# Patient Record
Sex: Male | Born: 1985 | Race: Black or African American | Hispanic: No | Marital: Single | Smoking: Current every day smoker
Health system: Southern US, Community
[De-identification: ages and names within clinical notes are randomized; demographics above are authoritative.]

---
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
## 2009-09-27 ENCOUNTER — Emergency Department (HOSPITAL_BASED_OUTPATIENT_CLINIC_OR_DEPARTMENT_OTHER): Admission: EM | Admit: 2009-09-27 | Discharge: 2009-09-27 | Payer: Self-pay | Admitting: Emergency Medicine

## 2009-09-27 ENCOUNTER — Ambulatory Visit: Payer: Self-pay | Admitting: Diagnostic Radiology

## 2014-12-23 ENCOUNTER — Encounter (HOSPITAL_COMMUNITY): Payer: Self-pay | Admitting: Emergency Medicine

## 2014-12-23 ENCOUNTER — Emergency Department (HOSPITAL_COMMUNITY)
Admission: EM | Admit: 2014-12-23 | Discharge: 2014-12-23 | Disposition: A | Discharge status: Home / Self Care | Payer: No Typology Code available for payment source | Attending: Emergency Medicine | Admitting: Emergency Medicine

## 2014-12-23 ENCOUNTER — Emergency Department (HOSPITAL_COMMUNITY): Payer: No Typology Code available for payment source

## 2014-12-23 DIAGNOSIS — S79911A Unspecified injury of right hip, initial encounter: Secondary | ICD-10-CM | POA: Diagnosis not present

## 2014-12-23 DIAGNOSIS — S4991XA Unspecified injury of right shoulder and upper arm, initial encounter: Secondary | ICD-10-CM | POA: Insufficient documentation

## 2014-12-23 DIAGNOSIS — S59911A Unspecified injury of right forearm, initial encounter: Secondary | ICD-10-CM | POA: Diagnosis not present

## 2014-12-23 DIAGNOSIS — Z720 Tobacco use: Secondary | ICD-10-CM | POA: Insufficient documentation

## 2014-12-23 DIAGNOSIS — Y92410 Unspecified street and highway as the place of occurrence of the external cause: Secondary | ICD-10-CM | POA: Diagnosis not present

## 2014-12-23 DIAGNOSIS — T148 Other injury of unspecified body region: Secondary | ICD-10-CM | POA: Diagnosis not present

## 2014-12-23 DIAGNOSIS — M79603 Pain in arm, unspecified: Secondary | ICD-10-CM

## 2014-12-23 DIAGNOSIS — Y9302 Activity, running: Secondary | ICD-10-CM | POA: Insufficient documentation

## 2014-12-23 DIAGNOSIS — M79601 Pain in right arm: Secondary | ICD-10-CM

## 2014-12-23 DIAGNOSIS — Y999 Unspecified external cause status: Secondary | ICD-10-CM | POA: Insufficient documentation

## 2014-12-23 MED ORDER — OXYCODONE-ACETAMINOPHEN 5-325 MG PO TABS
1.0000 | ORAL_TABLET | Freq: Once | ORAL | Status: AC
  Administered 2014-12-23: 1 via ORAL
  Filled 2014-12-23: qty 1
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
## 2014-12-23 NOTE — ED Notes (Signed)
Dr. Clydene Pugh at the bedside to update patient. Provided sling on discharge teaching.

## 2014-12-23 NOTE — Discharge Instructions (Signed)

## 2016-03-23 ENCOUNTER — Encounter (HOSPITAL_BASED_OUTPATIENT_CLINIC_OR_DEPARTMENT_OTHER): Payer: Self-pay | Admitting: Emergency Medicine

## 2016-03-23 ENCOUNTER — Emergency Department (HOSPITAL_BASED_OUTPATIENT_CLINIC_OR_DEPARTMENT_OTHER)
Admission: EM | Admit: 2016-03-23 | Discharge: 2016-03-23 | Disposition: A | Discharge status: Home / Self Care | Payer: Self-pay | Attending: Emergency Medicine | Admitting: Emergency Medicine

## 2016-03-23 DIAGNOSIS — L738 Other specified follicular disorders: Secondary | ICD-10-CM

## 2016-03-23 DIAGNOSIS — L731 Pseudofolliculitis barbae: Secondary | ICD-10-CM | POA: Insufficient documentation

## 2016-03-23 DIAGNOSIS — F17210 Nicotine dependence, cigarettes, uncomplicated: Secondary | ICD-10-CM | POA: Insufficient documentation

## 2016-03-23 MED ORDER — SULFAMETHOXAZOLE-TRIMETHOPRIM 800-160 MG PO TABS: 2.0000 | ORAL_TABLET | Freq: Two times a day (BID) | ORAL | 0 refills | Status: DC

## 2016-03-23 NOTE — ED Provider Notes (Signed)
[facility]   CSN: [id number] Arrival date & time: 03/23/16  0204     History   Chief Complaint Chief Complaint  Patient presents with  . Rash    HPI Rick Delgado is a 31 y.o. male. Patient's evaluation of her right facial rash. Itching. Small "yellow dots". No prior episodes.  HPI  History reviewed. No pertinent past medical history.  There are no active problems to display for this patient.   History reviewed. No pertinent surgical history.     Home Medications    Prior to Admission medications   Medication Sig Start Date End Date Taking? Authorizing Provider  sulfamethoxazole-trimethoprim (BACTRIM DS,SEPTRA DS) 800-160 MG tablet Take 2 tablets by mouth 2 (two) times daily. 03/23/16   Rolland PorterMark Sian Rockers, MD    Family History No family history on file.  Social History Social History  Substance Use Topics  . Smoking status: Current Every Day Smoker    Packs/day: 0.50    Types: Cigarettes  . Smokeless tobacco: Never Used  . Alcohol use No     Allergies   Patient has no known allergies.   Review of Systems Review of Systems  Constitutional: Negative for appetite change, chills, diaphoresis, fatigue and fever.  HENT: Negative for mouth sores, sore throat and trouble swallowing.        Right facial rash with itching  Eyes: Negative for visual disturbance.  Respiratory: Negative for cough, chest tightness, shortness of breath and wheezing.   Cardiovascular: Negative for chest pain.  Gastrointestinal: Negative for abdominal distention, abdominal pain, diarrhea, nausea and vomiting.  Endocrine: Negative for polydipsia, polyphagia and polyuria.  Genitourinary: Negative for dysuria, frequency and hematuria.  Musculoskeletal: Negative for gait problem.  Skin: Negative for color change, pallor and rash.  Neurological: Negative for dizziness, syncope, light-headedness and headaches.  Hematological: Does not bruise/bleed easily.    Psychiatric/Behavioral: Negative for behavioral problems and confusion.     Physical Exam Updated Vital Signs BP 117/85 (BP Location: Right Arm)   Pulse 88   Temp 97.7 F (36.5 C) (Oral)   Resp 20   Ht 6' (1.829 m)   Wt 155 lb (70.3 kg)   SpO2 100%   BMI 21.02 kg/m   Physical Exam  Constitutional: He is oriented to person, place, and time. He appears well-developed and well-nourished. No distress.  HENT:  Head: Normocephalic.  Eyes: Conjunctivae are normal. Pupils are equal, round, and reactive to light. No scleral icterus.  Multiple areas of folliculitis with pustules or follicle based on the right face.  Neck: Normal range of motion. Neck supple. No thyromegaly present.  Cardiovascular: Normal rate and regular rhythm.  Exam reveals no gallop and no friction rub.   No murmur heard. Pulmonary/Chest: Effort normal and breath sounds normal. No respiratory distress. He has no wheezes. He has no rales.  Abdominal: Soft. Bowel sounds are normal. He exhibits no distension. There is no tenderness. There is no rebound.  Musculoskeletal: Normal range of motion.  Neurological: He is alert and oriented to person, place, and time.  Skin: Skin is warm and dry. No rash noted.  Psychiatric: He has a normal mood and affect. His behavior is normal.     ED Treatments / Results  Labs (all labs ordered are listed, but only abnormal results are displayed) Labs Reviewed - No data to display  EKG  EKG Interpretation None       Radiology No results found.  Procedures Procedures (including critical care time)  Medications Ordered in ED Medications - No data to display   Initial Impression / Assessment and Plan / ED Course  I have reviewed the triage vital signs and the nursing notes.  Pertinent labs & imaging results that were available during my care of the patient were reviewed by me and considered in my medical decision making (see chart for details).  Clinical Course      We'll cover with Bactrim DS 2 by mouth twice a day. Primary care follow-up if not improving.  Final Clinical Impressions(s) / ED Diagnoses   Final diagnoses:  Folliculitis barbae    New Prescriptions New Prescriptions   SULFAMETHOXAZOLE-TRIMETHOPRIM (BACTRIM DS,SEPTRA DS) 800-160 MG TABLET    Take 2 tablets by mouth 2 (two) times daily.     Rolland Porter, MD 03/23/16 [phone]

## 2016-03-23 NOTE — ED Triage Notes (Signed)
Pt has rash on right side of face x 2 days

## 2016-05-05 ENCOUNTER — Encounter (HOSPITAL_BASED_OUTPATIENT_CLINIC_OR_DEPARTMENT_OTHER): Payer: Self-pay

## 2016-05-05 ENCOUNTER — Emergency Department (HOSPITAL_BASED_OUTPATIENT_CLINIC_OR_DEPARTMENT_OTHER)
Admission: EM | Admit: 2016-05-05 | Discharge: 2016-05-05 | Disposition: A | Discharge status: Home / Self Care | Payer: Self-pay | Attending: Emergency Medicine | Admitting: Emergency Medicine

## 2016-05-05 DIAGNOSIS — J029 Acute pharyngitis, unspecified: Secondary | ICD-10-CM | POA: Insufficient documentation

## 2016-05-05 DIAGNOSIS — F17210 Nicotine dependence, cigarettes, uncomplicated: Secondary | ICD-10-CM | POA: Insufficient documentation

## 2016-05-05 LAB — RAPID STREP SCREEN (MED CTR MEBANE ONLY): Streptococcus, Group A Screen (Direct): NEGATIVE

## 2016-05-05 MED ORDER — FLUTICASONE PROPIONATE 50 MCG/ACT NA SUSP: 2.0000 | Freq: Every day | NASAL | 0 refills | Status: AC

## 2016-05-05 MED ORDER — ACETAMINOPHEN 325 MG PO TABS
650.0000 mg | ORAL_TABLET | Freq: Once | ORAL | Status: AC
  Administered 2016-05-05: 650 mg via ORAL
  Filled 2016-05-05: qty 2

## 2016-05-05 MED ORDER — NAPROXEN 500 MG PO TABS: 500.0000 mg | ORAL_TABLET | Freq: Two times a day (BID) | ORAL | 0 refills | Status: DC

## 2016-05-05 MED ORDER — DEXAMETHASONE SODIUM PHOSPHATE 10 MG/ML IJ SOLN
10.0000 mg | Freq: Once | INTRAMUSCULAR | Status: AC
  Administered 2016-05-05: 10 mg via INTRAMUSCULAR
  Filled 2016-05-05: qty 1

## 2016-05-05 MED FILL — FLUTICASONE PROP 50 MCG SPR: 50 | 30 days supply | Qty: 16 | Fill #0

## 2016-05-05 MED FILL — NAPROXEN 500 MG TABLET: 500 | 15 days supply | Qty: 30 | Fill #0

## 2016-05-05 NOTE — ED Triage Notes (Signed)
C/o chills, sweats, sore throat x 3 days-NAD-steady gait

## 2016-05-05 NOTE — ED Provider Notes (Signed)
[facility]   CSN: [id number] Arrival date & time: 05/05/16  1543  By signing my name below, I, Rick Delgado, attest that this documentation has been prepared under the direction and in the presence of Rick Cha Gomillion, PA-C Electronically Signed: Soijett Delgado, ED Scribe. 05/05/16. 4:30 PM.  History   Chief Complaint Chief Complaint  Patient presents with  . Chills    HPI Rick Delgado is a 31 y.o. male who presents to the Emergency Department complaining of chills onset 3 days ago. Pt reports associated decreased appetite, sweats, sore throat, painful swallowing, and fever. Pt hasn't tried any medications for the relief of his symptoms. Pt notes that he didn't obtain his flu vaccination this past year. He denies body aches, trouble swallowing, nasal congestion, post-nasal drip, ear pain, ear pressure, cough, SOB, and any other symptoms.   The history is provided by the patient. No language interpreter was used.    History reviewed. No pertinent past medical history.  There are no active problems to display for this patient.   History reviewed. No pertinent surgical history.     Home Medications    Prior to Admission medications   Medication Sig Start Date End Date Taking? Authorizing Provider  fluticasone (FLONASE) 50 MCG/ACT nasal spray Place 2 sprays into both nostrils daily. 05/05/16   Everlene FarrierWilliam Markell Schrier, PA-C  naproxen (NAPROSYN) 500 MG tablet Take 1 tablet (500 mg total) by mouth 2 (two) times daily with a meal. 05/05/16   Everlene FarrierWilliam Anarely Nicholls, PA-C    Family History No family history on file.  Social History Social History  Substance Use Topics  . Smoking status: Current Every Day Smoker    Packs/day: 0.50    Types: Cigarettes  . Smokeless tobacco: Never Used  . Alcohol use No     Allergies   Patient has no known allergies.   Review of Systems Review of Systems  Constitutional: Positive for appetite change, chills and fever.  HENT:  Positive for sore throat. Negative for congestion, ear pain, postnasal drip, trouble swallowing and voice change.        +painful swallowing  Eyes: Negative for visual disturbance.  Respiratory: Negative for cough and shortness of breath.   Cardiovascular: Negative for chest pain.  Gastrointestinal: Negative for abdominal pain.  Musculoskeletal: Negative for myalgias.  Skin: Negative for rash and wound.  Neurological: Negative for headaches.     Physical Exam Updated Vital Signs BP 113/69 (BP Location: Right Arm)   Pulse 86   Temp 100.5 F (38.1 C) (Oral)   Resp 16   Ht 6' (1.829 m)   Wt 68 kg   SpO2 98%   BMI 20.34 kg/m   Physical Exam  Constitutional: He appears well-developed and well-nourished. No distress.  Nontoxic appearing.  HENT:  Head: Normocephalic and atraumatic.  Right Ear: External ear and ear canal normal. Tympanic membrane is not erythematous. A middle ear effusion is present.  Left Ear: External ear and ear canal normal. Tympanic membrane is not erythematous. A middle ear effusion is present.  Nose: Rhinorrhea present.  Mouth/Throat: Uvula is midline and mucous membranes are normal. No trismus in the jaw. Oropharyngeal exudate and posterior oropharyngeal edema present.  Mild middle ear effusion bilaterally without TM erythema or loss of landmarks. Boggy nasal turbinates bilaterally with rhinorrhea present.  Mild bilateral tonsillar hypertrophy with exudate. Uvula midline without edema. No PTA. No trismus. No drooling. Tongue protrusion nl.   Eyes: Conjunctivae are normal. Pupils are equal, round,  and reactive to light. Right eye exhibits no discharge. Left eye exhibits no discharge.  Neck: Normal range of motion. Neck supple. No JVD present. No tracheal deviation present.  Cardiovascular: Normal rate, regular rhythm, normal heart sounds and intact distal pulses.   Pulmonary/Chest: Effort normal and breath sounds normal. No stridor. No respiratory distress. He  has no wheezes. He has no rales.  Lungs clear to auscultation bilaterally.  Abdominal: Soft. There is no tenderness.  Lymphadenopathy:    He has no cervical adenopathy.  Neurological: He is alert. Coordination normal.  Skin: Skin is warm and dry. Capillary refill takes less than 2 seconds. No rash noted. He is not diaphoretic. No erythema.  Psychiatric: He has a normal mood and affect. His behavior is normal.  Nursing note and vitals reviewed.    ED Treatments / Results  DIAGNOSTIC STUDIES: Oxygen Saturation is 100% on RA, nl by my interpretation.    COORDINATION OF CARE: 4:30 PM Discussed treatment plan with pt at bedside which includes rapid strep screen and culture, tylenol, decadron injection, naprosyn Rx, flonase Rx, and pt agreed to plan.   Labs (all labs ordered are listed, but only abnormal results are displayed) Labs Reviewed  RAPID STREP SCREEN (NOT AT [facility])  CULTURE, GROUP A STREP [facility])    Procedures Procedures (including critical care time)  Medications Ordered in ED Medications  acetaminophen (TYLENOL) tablet 650 mg (650 mg Oral Given 05/05/16 1556)  dexamethasone (DECADRON) injection 10 mg (10 mg Intramuscular Given 05/05/16 1639)     Initial Impression / Assessment and Plan / ED Course  I have reviewed the triage vital signs and the nursing notes.  Pertinent labs that were available during my care of the patient were reviewed by me and considered in my medical decision making (see chart for details).      Pt with negative strep. Diagnosis of viral pharyngitis. No abx indicated at this time. Discussed that results of strep culture are pending and patient Rick be informed if positive result and abx Rick be called in at that time. Rick be treated with tylenol and decadron injection while in the ED. Discharge with naprosyn Rx and fluticasone Rx. No evidence of dehydration. Pt is tolerating secretions. Presentation not concerning for peritonsillar abscess or  spread of infection to deep spaces of the throat; patent airway. Specific return precautions discussed. Recommended PCP follow up. Pt appears safe for discharge.  Final Clinical Impressions(s) / ED Diagnoses   Final diagnoses:  Viral pharyngitis    New Prescriptions Discharge Medication List as of 05/05/2016  4:30 PM    START taking these medications   Details  fluticasone (FLONASE) 50 MCG/ACT nasal spray Place 2 sprays into both nostrils daily., Starting Thu 05/05/2016, Print    naproxen (NAPROSYN) 500 MG tablet Take 1 tablet (500 mg total) by mouth 2 (two) times daily with a meal., Starting Thu 05/05/2016, Print       I personally performed the services described in this documentation, which was scribed in my presence. The recorded information has been reviewed and is accurate.       Everlene Farrier, PA-C 05/05/16 1645    Alvira Monday, MD 05/09/16 [phone]

## 2016-05-07 LAB — CULTURE, GROUP A STREP (THRC)

## 2020-08-31 ENCOUNTER — Emergency Department (HOSPITAL_BASED_OUTPATIENT_CLINIC_OR_DEPARTMENT_OTHER): Payer: Self-pay

## 2020-08-31 ENCOUNTER — Emergency Department (HOSPITAL_BASED_OUTPATIENT_CLINIC_OR_DEPARTMENT_OTHER)
Admission: EM | Admit: 2020-08-31 | Discharge: 2020-08-31 | Disposition: A | Discharge status: Home / Self Care | Payer: Self-pay | Attending: Emergency Medicine | Admitting: Emergency Medicine

## 2020-08-31 ENCOUNTER — Other Ambulatory Visit: Payer: Self-pay

## 2020-08-31 ENCOUNTER — Encounter (HOSPITAL_BASED_OUTPATIENT_CLINIC_OR_DEPARTMENT_OTHER): Payer: Self-pay | Admitting: *Deleted

## 2020-08-31 DIAGNOSIS — F17210 Nicotine dependence, cigarettes, uncomplicated: Secondary | ICD-10-CM | POA: Insufficient documentation

## 2020-08-31 DIAGNOSIS — R0602 Shortness of breath: Secondary | ICD-10-CM | POA: Insufficient documentation

## 2020-08-31 DIAGNOSIS — R0789 Other chest pain: Secondary | ICD-10-CM | POA: Insufficient documentation

## 2020-08-31 DIAGNOSIS — R002 Palpitations: Secondary | ICD-10-CM | POA: Insufficient documentation

## 2020-08-31 LAB — BASIC METABOLIC PANEL
Anion gap: 7 (ref 5–15)
BUN: 14 mg/dL (ref 6–20)
CO2: 28 mmol/L (ref 22–32)
Calcium: 9.1 mg/dL (ref 8.9–10.3)
Chloride: 102 mmol/L (ref 98–111)
Creatinine, Ser: 1.07 mg/dL (ref 0.61–1.24)
GFR, Estimated: >60 mL/min (ref >60)
Glucose, Bld: 92 mg/dL (ref 70–99)
Potassium: 4.1 mmol/L (ref 3.5–5.1)
Sodium: 137 mmol/L (ref 135–145)

## 2020-08-31 LAB — CBC
HCT: 43.2 % (ref 39.0–52.0)
Hemoglobin: 14.3 g/dL (ref 13.0–17.0)
MCH: 31.8 pg (ref 26.0–34.0)
MCHC: 33.1 g/dL (ref 30.0–36.0)
MCV: 96 fL (ref 80.0–100.0)
Platelets: 210 10*3/uL (ref 150–400)
RBC: 4.5 MIL/uL (ref 4.22–5.81)
RDW: 12.3 % (ref 11.5–15.5)
WBC: 8.6 10*3/uL (ref 4.0–10.5)
nRBC: 0 % (ref 0.0–0.2)

## 2020-08-31 LAB — TROPONIN I (HIGH SENSITIVITY): Troponin I (High Sensitivity): <2 ng/L (ref <18)

## 2020-08-31 MED ORDER — IBUPROFEN 800 MG PO TABS
800.0000 mg | ORAL_TABLET | Freq: Once | ORAL | Status: AC
  Administered 2020-08-31: 800 mg via ORAL
  Filled 2020-08-31: qty 1

## 2020-08-31 NOTE — ED Provider Notes (Signed)
[facility] EMERGENCY DEPARTMENT Provider Note   CSN: [id number] Arrival date & time: 08/31/20  1054     History Chief Complaint  Patient presents with   Chest Pain    Rick Delgado is a 35 y.o. male.  35yo M who p/w chest pain. 3d ago while at rest, he began having chest pressure that has been constant since it began, non-exertional, and associated w/ mild SOB. Pain is worse w/ certain movements. Had some heart palpitations/racing yesterday. No N/V, diaphoresis, cough/cold sx, leg swelling/pain, or recent travel. No drug use. He has tried tylenol without relief.  Pt also requested STD screening.  The history is provided by the patient.  Chest Pain     History reviewed. No pertinent past medical history.  There are no problems to display for this patient.   History reviewed. No pertinent surgical history.     No family history on file.  Social History   Tobacco Use   Smoking status: Every Day    Packs/day: 0.50    Pack years: 0.00    Types: Cigarettes   Smokeless tobacco: Never  Vaping Use   Vaping Use: Never used  Substance Use Topics   Alcohol use: Yes   Drug use: No    Home Medications Prior to Admission medications   Medication Sig Start Date End Date Taking? Authorizing Provider  fluticasone (FLONASE) 50 MCG/ACT nasal spray Place 2 sprays into both nostrils daily. 05/05/16   Everlene Farrier, PA-C  naproxen (NAPROSYN) 500 MG tablet Take 1 tablet (500 mg total) by mouth 2 (two) times daily with a meal. 05/05/16   Everlene Farrier, PA-C    Allergies    Patient has no known allergies.  Review of Systems   Review of Systems  Cardiovascular:  Positive for chest pain.  All other systems reviewed and are negative except that which was mentioned in HPI  Physical Exam Updated Vital Signs BP 129/75 (BP Location: Right Arm)   Pulse 74   Temp 98.7 F (37.1 C) (Oral)   Resp 18   Ht 6' (1.829 m)   Wt 70.3 kg   SpO2 100%   BMI 21.02  kg/m   Physical Exam Constitutional:      General: He is not in acute distress.    Appearance: Normal appearance.  HENT:     Head: Normocephalic and atraumatic.  Eyes:     Conjunctiva/sclera: Conjunctivae normal.  Cardiovascular:     Rate and Rhythm: Normal rate and regular rhythm.     Heart sounds: Normal heart sounds. No murmur heard. Pulmonary:     Effort: Pulmonary effort is normal.     Breath sounds: Normal breath sounds.  Chest:     Chest wall: Tenderness present.     Comments: Exquisitely tender to light palpation L lower anterior chest just below nipple Abdominal:     General: Abdomen is flat. Bowel sounds are normal. There is no distension.     Palpations: Abdomen is soft.     Tenderness: There is no abdominal tenderness.  Musculoskeletal:     Right lower leg: No edema.     Left lower leg: No edema.  Skin:    General: Skin is warm and dry.  Neurological:     Mental Status: He is alert and oriented to person, place, and time.     Comments: fluent  Psychiatric:        Mood and Affect: Mood normal.  Behavior: Behavior normal.    ED Results / Procedures / Treatments   Labs (all labs ordered are listed, but only abnormal results are displayed) Labs Reviewed  BASIC METABOLIC PANEL  CBC  GC/CHLAMYDIA PROBE AMP ([place]) NOT AT [facility]  TROPONIN I (HIGH SENSITIVITY)    EKG EKG Interpretation  Date/Time:  Monday August 31 2020 11:10:27 EDT Ventricular Rate:  86 PR Interval:  124 QRS Duration: 88 QT Interval:  340 QTC Calculation: 406 R Axis:   96 Text Interpretation: Normal sinus rhythm with sinus arrhythmia Rightward axis Pulmonary disease pattern Abnormal ECG No previous ECGs available Confirmed by Frederick Peers [phone]) on 08/31/2020 11:50:16 AM  Radiology DG Chest 2 View  Result Date: 08/31/2020 CLINICAL DATA:  Chest pain for 3 days. EXAM: CHEST - 2 VIEW COMPARISON:  Prior chest radiographs 12/23/2014 and earlier. FINDINGS: Heart size within  normal limits. No appreciable airspace consolidation. No evidence of pleural effusion or pneumothorax. No acute bony abnormality identified. IMPRESSION: No evidence of active cardiopulmonary disease. Electronically Signed   By: Jackey Loge DO   On: 08/31/2020 12:45    Procedures Procedures   Medications Ordered in ED Medications  ibuprofen (ADVIL) tablet 800 mg (800 mg Oral Given 08/31/20 1250)    ED Course  I have reviewed the triage vital signs and the nursing notes.  Pertinent labs & imaging results that were available during my care of the patient were reviewed by me and considered in my medical decision making (see chart for details).    MDM Rules/Calculators/A&P                          CP reproducible on exam, point tender on L chest. Also notes exacerbation of pain with certain movements, which suggests non-cardiac etiology. EKG normal, CXR clear. Trop and basic labs normal. Given >24h of continuous pain, I feel that single trop is sufficient in this low-risk patient.  Discussed supportive measures including NSAIDs for symptoms and reviewed return precautions.  He voiced understanding. Final Clinical Impression(s) / ED Diagnoses Final diagnoses:  Chest wall pain    Rx / DC Orders ED Discharge Orders     None        Darius Lundberg, Ambrose Finland, MD 08/31/20 1400

## 2020-08-31 NOTE — ED Triage Notes (Addendum)
Sharp pain in his left chest with movement x 3 days. States he thinks his heart is going to stop. EKG at triage. No distress. States while he is here he would like to be checked for an STD.

## 2020-09-01 LAB — GC/CHLAMYDIA PROBE AMP (~~LOC~~) NOT AT ARMC
Chlamydia: NEGATIVE
Comment: NEGATIVE
Comment: NORMAL
Neisseria Gonorrhea: NEGATIVE

## 2020-10-18 ENCOUNTER — Encounter (HOSPITAL_BASED_OUTPATIENT_CLINIC_OR_DEPARTMENT_OTHER): Payer: Self-pay | Admitting: Emergency Medicine

## 2020-10-18 ENCOUNTER — Other Ambulatory Visit: Payer: Self-pay

## 2020-10-18 ENCOUNTER — Emergency Department (HOSPITAL_BASED_OUTPATIENT_CLINIC_OR_DEPARTMENT_OTHER): Payer: Self-pay

## 2020-10-18 ENCOUNTER — Emergency Department (HOSPITAL_BASED_OUTPATIENT_CLINIC_OR_DEPARTMENT_OTHER)
Admission: EM | Admit: 2020-10-18 | Discharge: 2020-10-18 | Disposition: A | Discharge status: Home / Self Care | Payer: Self-pay | Attending: Emergency Medicine | Admitting: Emergency Medicine

## 2020-10-18 DIAGNOSIS — R0789 Other chest pain: Secondary | ICD-10-CM | POA: Insufficient documentation

## 2020-10-18 DIAGNOSIS — X500XXA Overexertion from strenuous movement or load, initial encounter: Secondary | ICD-10-CM | POA: Insufficient documentation

## 2020-10-18 DIAGNOSIS — F17210 Nicotine dependence, cigarettes, uncomplicated: Secondary | ICD-10-CM | POA: Insufficient documentation

## 2020-10-18 LAB — CBC
HCT: 44.7 % (ref 39.0–52.0)
Hemoglobin: 14.9 g/dL (ref 13.0–17.0)
MCH: 32.1 pg (ref 26.0–34.0)
MCHC: 33.3 g/dL (ref 30.0–36.0)
MCV: 96.3 fL (ref 80.0–100.0)
Platelets: 229 10*3/uL (ref 150–400)
RBC: 4.64 MIL/uL (ref 4.22–5.81)
RDW: 12.3 % (ref 11.5–15.5)
WBC: 11.5 10*3/uL — ABNORMAL HIGH (ref 4.0–10.5)
nRBC: 0 % (ref 0.0–0.2)

## 2020-10-18 LAB — BASIC METABOLIC PANEL
Anion gap: 8 (ref 5–15)
BUN: 11 mg/dL (ref 6–20)
CO2: 31 mmol/L (ref 22–32)
Calcium: 9.5 mg/dL (ref 8.9–10.3)
Chloride: 102 mmol/L (ref 98–111)
Creatinine, Ser: 1.11 mg/dL (ref 0.61–1.24)
GFR, Estimated: >60 mL/min (ref >60)
Glucose, Bld: 68 mg/dL — ABNORMAL LOW (ref 70–99)
Potassium: 4.2 mmol/L (ref 3.5–5.1)
Sodium: 141 mmol/L (ref 135–145)

## 2020-10-18 LAB — TROPONIN I (HIGH SENSITIVITY): Troponin I (High Sensitivity): 2 ng/L (ref <18)

## 2020-10-18 MED ORDER — CYCLOBENZAPRINE HCL 5 MG PO TABS
5.0000 mg | ORAL_TABLET | Freq: Once | ORAL | Status: AC
  Administered 2020-10-18: 5 mg via ORAL
  Filled 2020-10-18: qty 1

## 2020-10-18 MED ORDER — IBUPROFEN 800 MG PO TABS
800.0000 mg | ORAL_TABLET | Freq: Once | ORAL | Status: AC
  Administered 2020-10-18: 800 mg via ORAL
  Filled 2020-10-18: qty 1

## 2020-10-18 MED ORDER — CYCLOBENZAPRINE HCL 10 MG PO TABS: 5.0000 mg | ORAL_TABLET | Freq: Two times a day (BID) | ORAL | 0 refills | Status: AC | PRN

## 2020-10-18 MED ORDER — NAPROXEN 375 MG PO TABS: 375.0000 mg | ORAL_TABLET | Freq: Two times a day (BID) | ORAL | 0 refills | Status: AC

## 2020-10-18 NOTE — ED Triage Notes (Signed)
Pt reports waking up with left sided chest pressure that has been going on all day; pt reports worse with movement and breathing; seen two weeks ago for the same thing; denies [facility] or N/V

## 2020-10-18 NOTE — ED Provider Notes (Signed)
[facility] EMERGENCY DEPARTMENT Provider Note   CSN: [id number] Arrival date & time: 10/18/20  1933     History Chief Complaint  Patient presents with   Chest Pain    Rick Delgado is a 35 y.o. male.  He complains of pain over the left anterior chest wall.  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  Today he woke with pain over the left chest wall that is worse whenever he twists bends or leans forward.  He states that he had trouble getting something out of the oven today because it hurt to bend forward.  It hurts also if he takes a very deep breath.  He denies any shortness of breath, hemoptysis or injury to the chest wall.  He denies diaphoresis.  He is a daily smoker.   Chest Pain Associated symptoms: no diaphoresis, no fever, no nausea, no palpitations and no vomiting       History reviewed. No pertinent past medical history.  There are no problems to display for this patient.   History reviewed. No pertinent surgical history.     No family history on file.  Social History   Tobacco Use   Smoking status: Every Day    Packs/day: 0.50    Types: Cigarettes   Smokeless tobacco: Never  Vaping Use   Vaping Use: Never used  Substance Use Topics   Alcohol use: Yes   Drug use: No    Home Medications Prior to Admission medications   Medication Sig Start Date End Date Taking? Authorizing Provider  cyclobenzaprine (FLEXERIL) 10 MG tablet Take 0.5-1 tablets (5-10 mg total) by mouth 2 (two) times daily as needed for muscle spasms. 10/18/20  Yes Rick Amezcua, PA-C  naproxen (NAPROSYN) 375 MG tablet Take 1 tablet (375 mg total) by mouth 2 (two) times daily with a meal. 10/18/20  Yes Rick Canepa, PA-C  fluticasone (FLONASE) 50 MCG/ACT nasal spray Place 2 sprays into both nostrils daily. 05/05/16   Rick Farrier, PA-C    Allergies    Patient has no known allergies.  Review of Systems   Review of  Systems  Constitutional:  Negative for chills, diaphoresis and fever.  Cardiovascular:  Positive for chest pain. Negative for palpitations and leg swelling.  Gastrointestinal:  Negative for nausea and vomiting.  All other systems reviewed and are negative.  Physical Exam Updated Vital Signs BP 114/68   Pulse 77   Temp 99.3 F (37.4 C) (Oral)   Resp 18   Ht 6' (1.829 m)   Wt 70.3 kg   SpO2 100%   BMI 21.02 kg/m   Physical Exam  Nursing note and vitals reviewed. Constitutional: He appears well-developed and well-nourished. No distress.  HENT:  Head: Normocephalic and atraumatic.  Eyes: Conjunctivae normal are normal. No scleral icterus.  Neck: Normal range of motion. Neck supple.  Cardiovascular: Normal rate, regular rhythm and normal heart sounds.   Pulmonary/Chest: Breath sounds normal.  Tenderness over the left anterior chest, pain with range of motion of the trunk and is worse with right and left lateral flexion and leftward truncal rotation.  No obvious rashes. Abdominal: Soft. There is no tenderness.  Musculoskeletal: He exhibits no edema.  Neurological: He is alert.  Skin: Skin is warm and dry. He is not diaphoretic.  Psychiatric: His behavior is normal.    ED Results / Procedures / Treatments   Labs (all labs ordered are listed, but only  abnormal results are displayed) Labs Reviewed  BASIC METABOLIC PANEL - Abnormal; Notable for the following components:      Result Value   Glucose, Bld 68 (*)    All other components within normal limits  CBC - Abnormal; Notable for the following components:   WBC 11.5 (*)    All other components within normal limits  TROPONIN I (HIGH SENSITIVITY)    EKG None  Radiology DG Chest 2 View  Result Date: 10/18/2020 CLINICAL DATA:  Chest pain.  Left-sided chest pressure. EXAM: CHEST - 2 VIEW COMPARISON:  08/31/2020 FINDINGS: The cardiomediastinal contours are normal. The lungs are clear. Pulmonary vasculature is normal. No  consolidation, pleural effusion, or pneumothorax. No acute osseous abnormalities are seen. IMPRESSION: Negative radiographs of the chest. Electronically Signed   By: Rick Delgado M.D.   On: 10/18/2020 20:12    Procedures Procedures   Medications Ordered in ED Medications  ibuprofen (ADVIL) tablet 800 mg (800 mg Oral Given 10/18/20 2205)  cyclobenzaprine (FLEXERIL) tablet 5 mg (5 mg Oral Given 10/18/20 2205)    ED Course  I have reviewed the triage vital signs and the nursing notes.  Pertinent labs & imaging results that were available during my care of the patient were reviewed by me and considered in my medical decision making (see chart for details).    MDM Rules/Calculators/A&P                          Given the large differential diagnosis for Rick Delgado, the decision making in this case is of high complexity. Triage labs including CBC, BMP and troponin without significant abnormality.  I reviewed and interpreted 2 view chest x-ray ordered at triage without any abnormalities. After evaluating all of the data points in this case, the presentation of Rick Delgado is NOT consistent with Acute Coronary Syndrome (ACS) and/or myocardial ischemia, pulmonary embolism, aortic dissection; Rick Delgado, significant arrythmia, pneumothorax, cardiac tamponade, or other emergent cardiopulmonary condition.  Further, the presentation of Rick Delgado is NOT consistent with pericarditis, myocarditis, cholecystitis, pancreatitis, mediastinitis, endocarditis, new valvular disease.  Additionally, the presentation of Rick Q Ledbetteris NOT consistent with flail chest, cardiac contusion, ARDS, or significant intra-thoracic or intra-abdominal bleeding.  Moreover, this presentation is NOT consistent with pneumonia, sepsis, or pyelonephritis.  The patient appears to have strained his serratus anterior or intercostal muscles on the left versus oblique.  Ultimately this  appears to be a musculoskeletal injury.   Strict return and follow-up precautions have been given by me personally or by detailed written instruction given verbally by nursing staff using the teach back method to the patient/family/caregiver(s).  Data Reviewed/Counseling: I have reviewed the patient's vital signs, nursing notes, and other relevant tests/information. I had a detailed discussion regarding the historical points, exam findings, and any diagnostic results supporting the discharge diagnosis. I also discussed the need for outpatient follow-up and the need to return to the ED if symptoms worsen or if there are any questions or concerns that arise at home.     Final Clinical Impression(s) / ED Diagnoses Final diagnoses:  Chest wall pain    Rx / DC Orders ED Discharge Orders          Ordered    naproxen (NAPROSYN) 375 MG tablet  2 times daily with meals        10/18/20 2148    cyclobenzaprine (FLEXERIL) 10 MG tablet  2 times daily PRN  10/18/20 2148             Arthor Captain, PA-C 10/19/20 1109    Tegeler, Canary Brim, MD 10/19/20 [phone]

## 2020-10-18 NOTE — Discharge Instructions (Signed)
You have been diagnosed by your caregiver as having chest wall pain. °SEEK IMMEDIATE MEDICAL ATTENTION IF: °You develop a fever.  °Your chest pains become severe or intolerable.  °You develop new, unexplained symptoms (problems).  °You develop shortness of breath, nausea, vomiting, sweating or feel light headed.  °You develop a new cough or you cough up blood. ° °

## 2022-01-14 IMAGING — CR DG CHEST 2V
2 series · 2 of 2 positions shown · non-contrast
Comparison: Prior chest radiographs 12/23/2014 and earlier.

CLINICAL DATA: Chest pain for 3 days.

EXAM:
CHEST - 2 VIEW

[w chest pa]
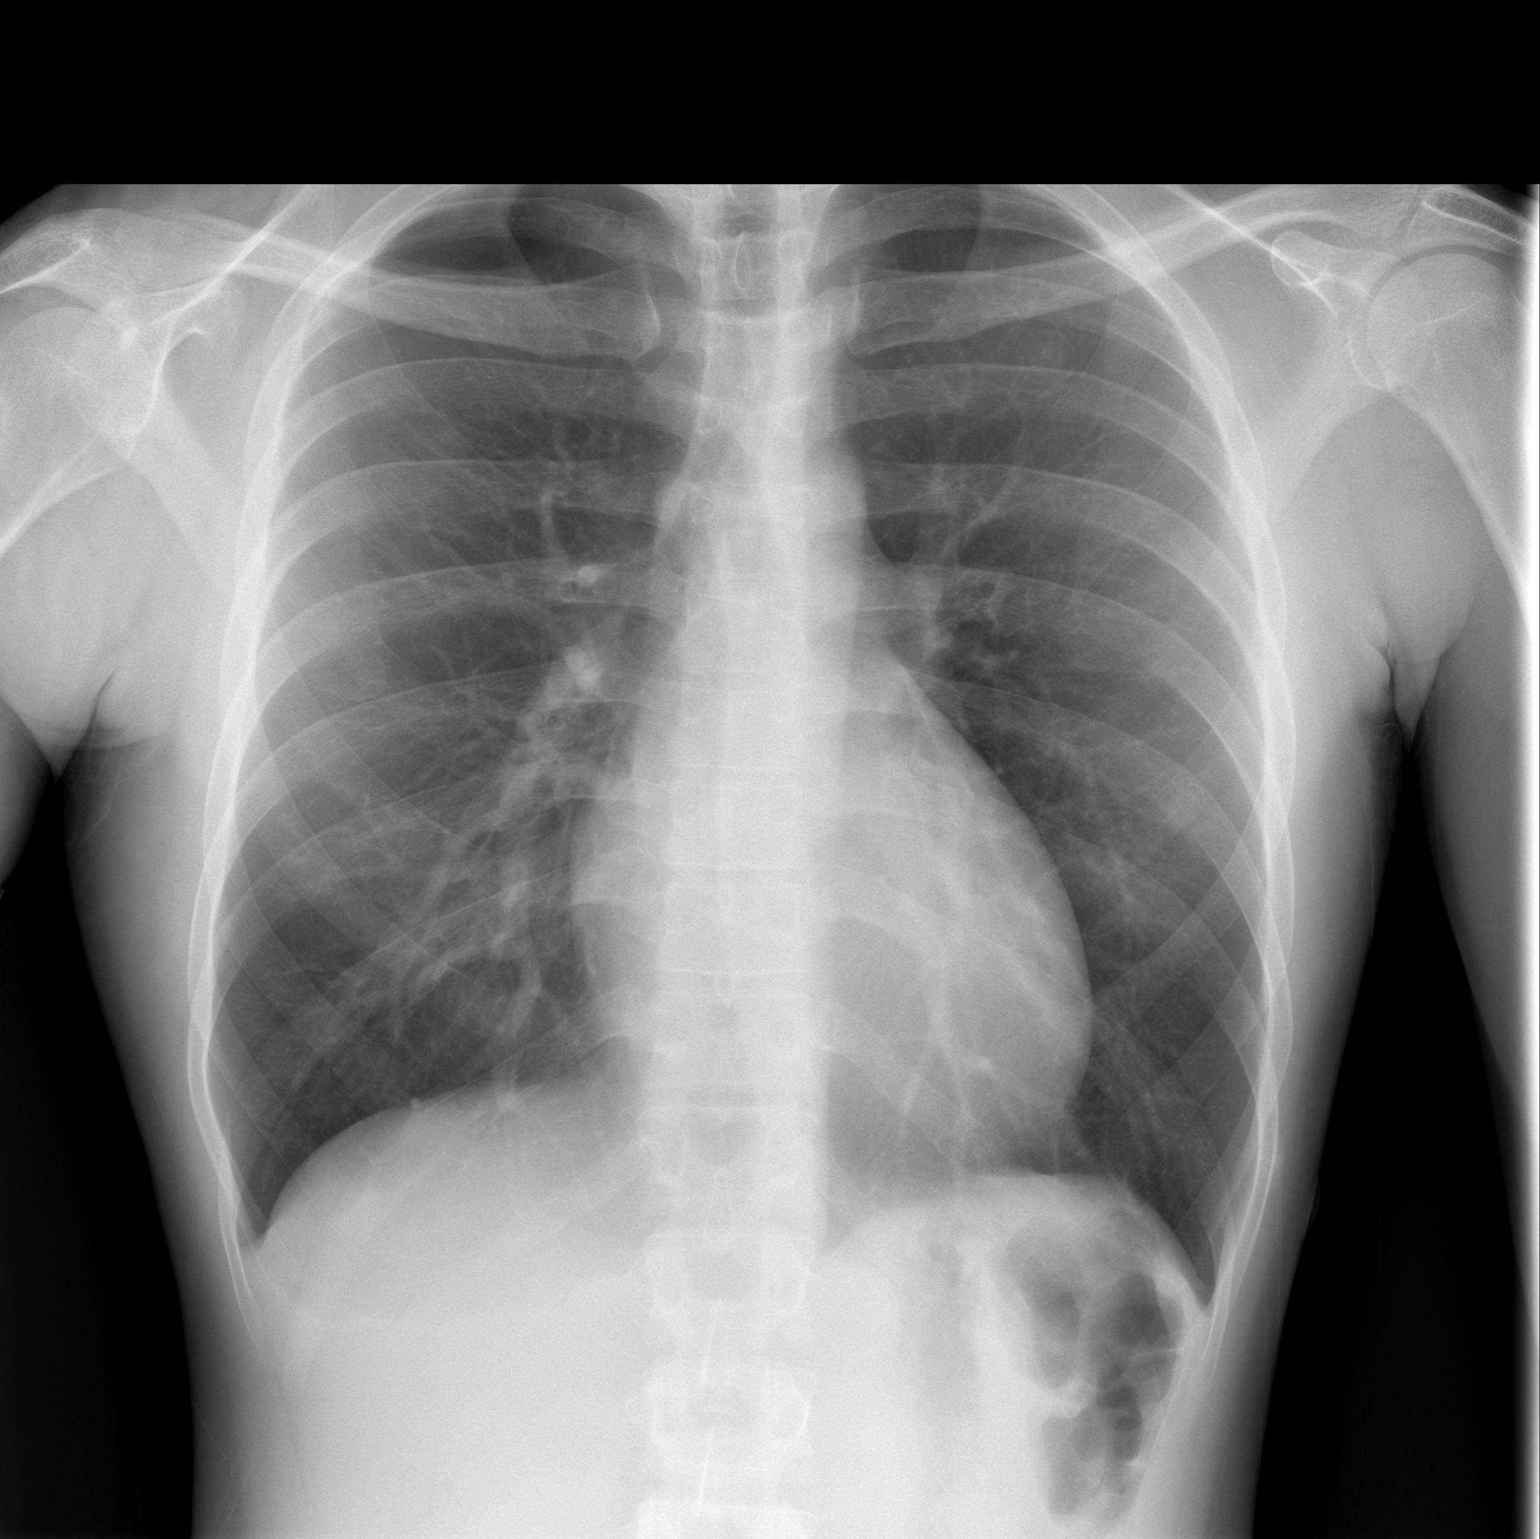

[w chest lat]
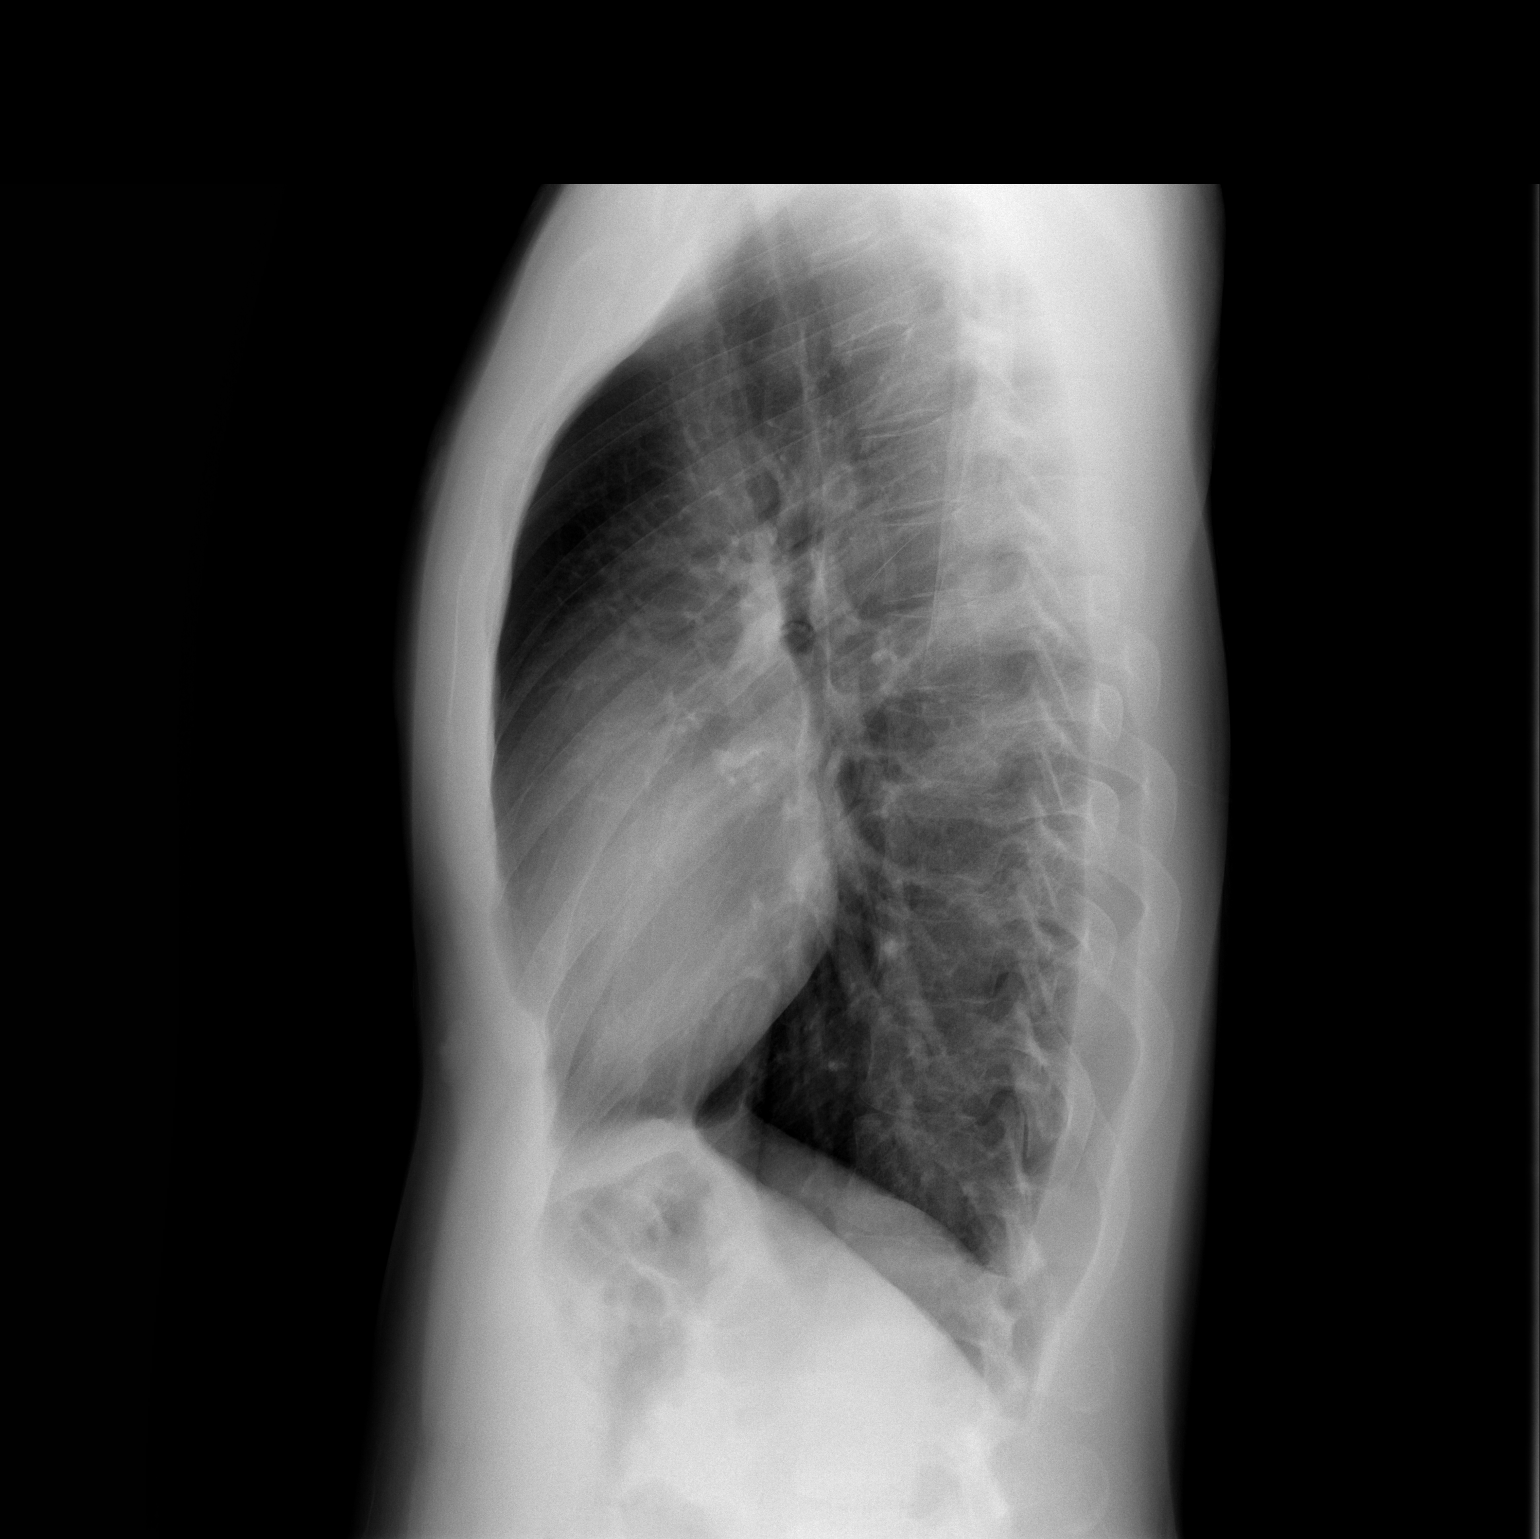

[2 of 2 positions shown; findings below may reference images not displayed]

FINDINGS: Heart size within normal limits. No appreciable airspace
consolidation. No evidence of pleural effusion or pneumothorax. No
acute bony abnormality identified.
IMPRESSION: No evidence of active cardiopulmonary disease.

## 2022-03-03 IMAGING — CR DG CHEST 2V
2 series · 2 of 2 positions shown · non-contrast
Comparison: 08/31/2020

CLINICAL DATA: Chest pain.  Left-sided chest pressure.

EXAM:
CHEST - 2 VIEW

[w chest pa]
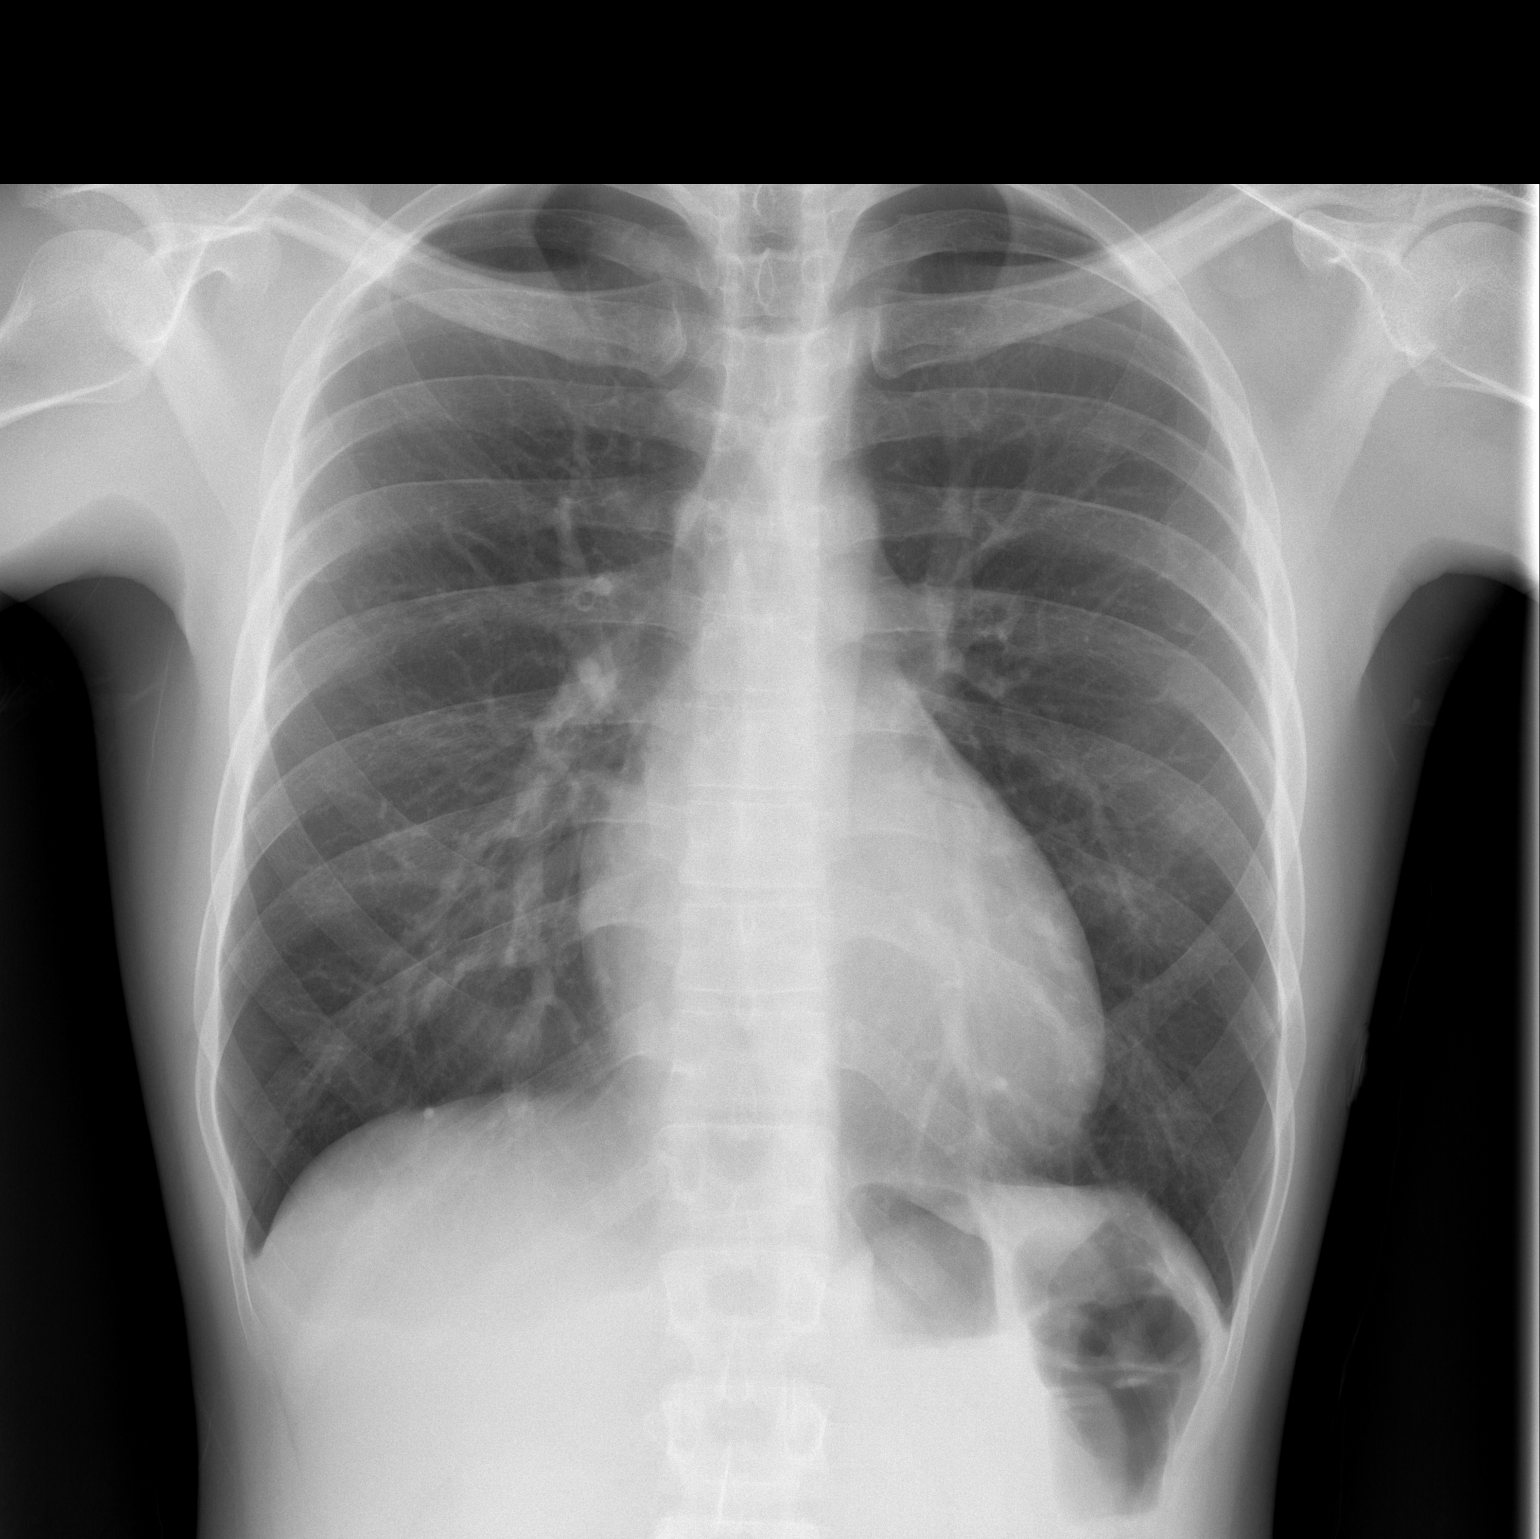

[w chest lat]
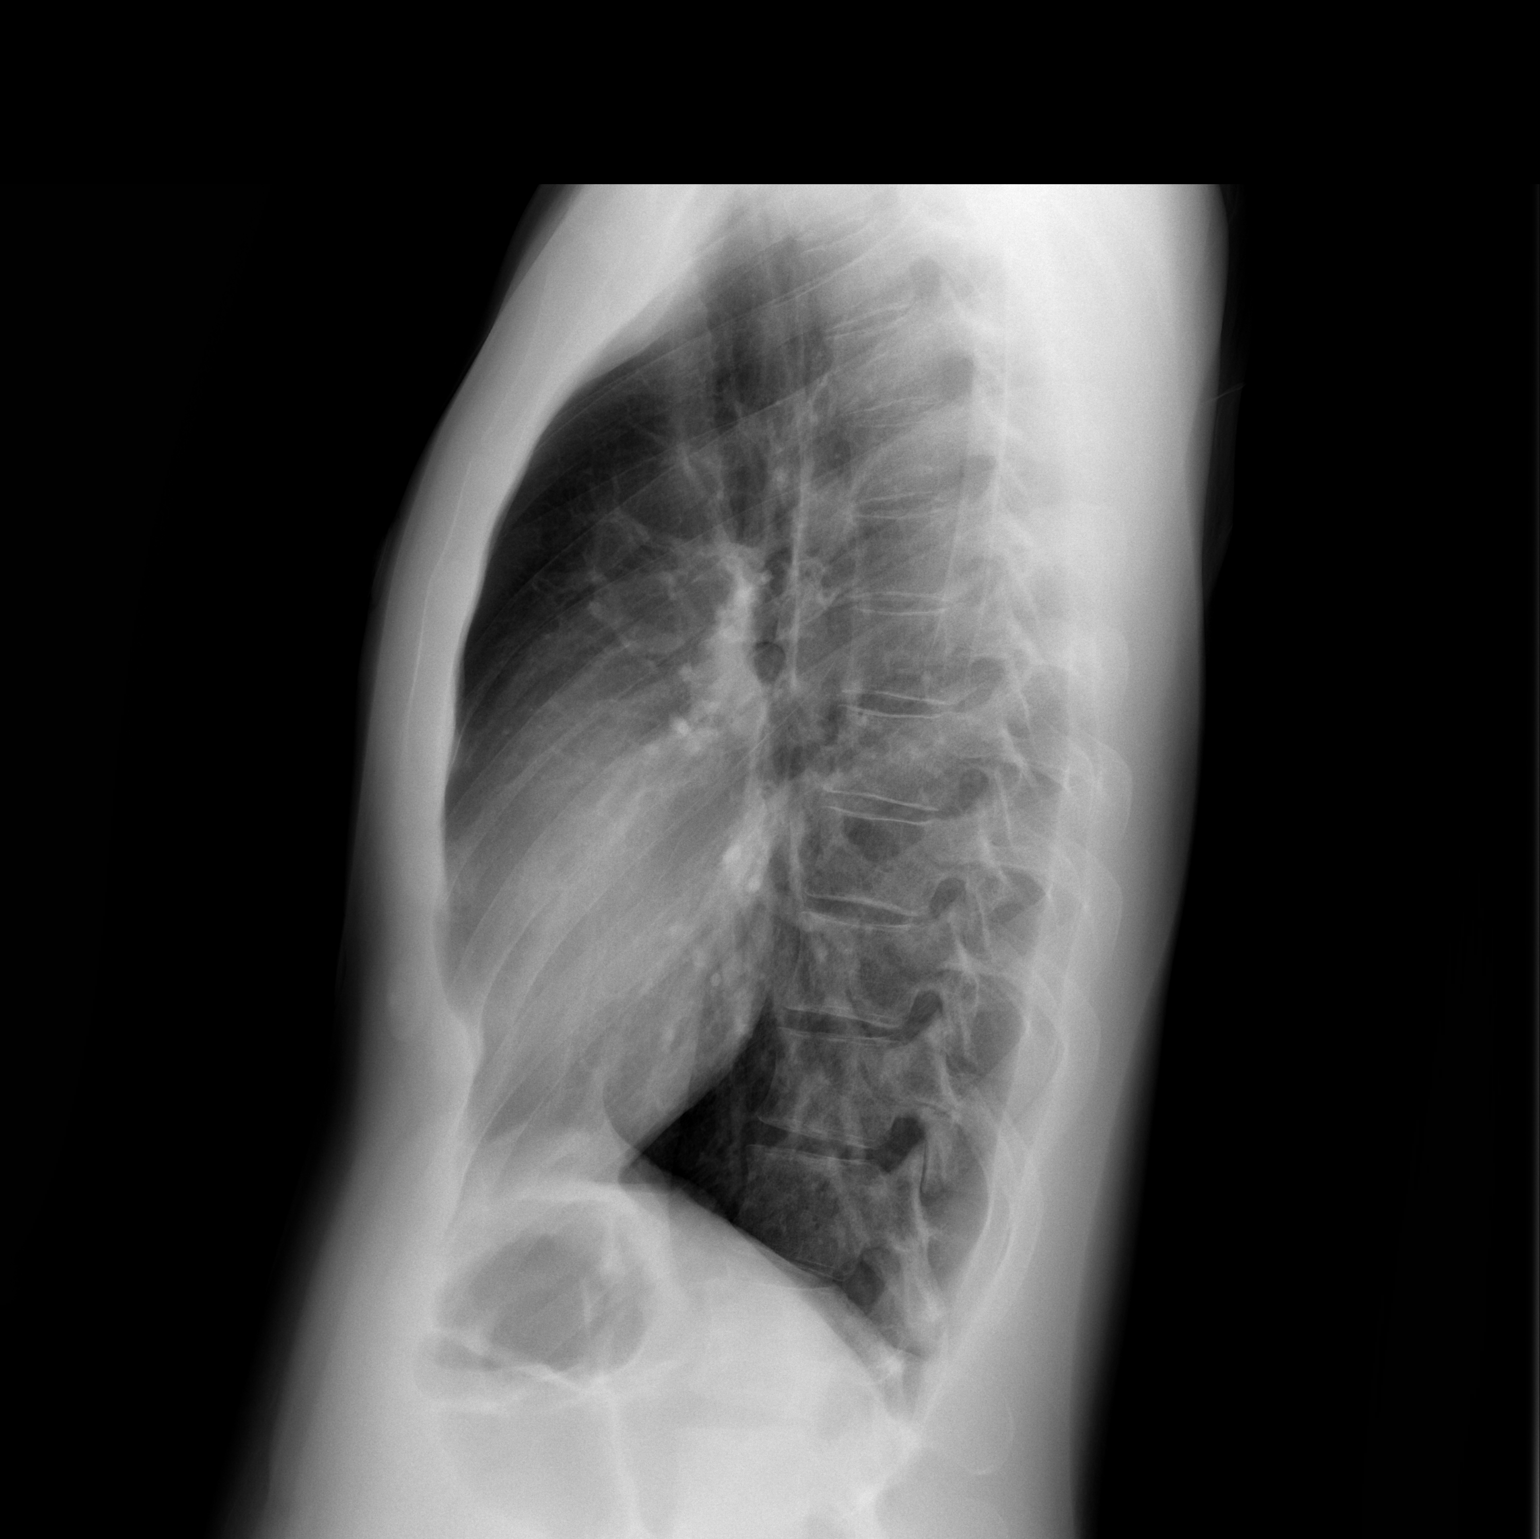

[2 of 2 positions shown; findings below may reference images not displayed]

FINDINGS: The cardiomediastinal contours are normal. The lungs are clear.
Pulmonary vasculature is normal. No consolidation, pleural effusion,
or pneumothorax. No acute osseous abnormalities are seen.
IMPRESSION: Negative radiographs of the chest.
